# Patient Record
Sex: Female | Born: 1955 | Race: White | Hispanic: No | Marital: Married | Smoking: Never smoker
Health system: Southern US, Community
[De-identification: ages and names within clinical notes are randomized; demographics above are authoritative.]

## PROBLEM LIST (undated history)

## (undated) DIAGNOSIS — E7800 Pure hypercholesterolemia, unspecified: Secondary | ICD-10-CM

## (undated) HISTORY — PX: CHOLECYSTECTOMY: SHX55

## (undated) HISTORY — PX: ABDOMINAL HYSTERECTOMY: SHX81

## (undated) HISTORY — PX: HERNIA REPAIR: SHX51

---

## 2015-03-13 ENCOUNTER — Emergency Department (HOSPITAL_COMMUNITY): Payer: BLUE CROSS/BLUE SHIELD

## 2015-03-13 ENCOUNTER — Encounter (HOSPITAL_COMMUNITY): Payer: Self-pay | Admitting: Family Medicine

## 2015-03-13 ENCOUNTER — Emergency Department (HOSPITAL_COMMUNITY)
Admission: EM | Admit: 2015-03-13 | Discharge: 2015-03-13 | Disposition: A | Discharge status: Home / Self Care | Payer: BLUE CROSS/BLUE SHIELD | Attending: Emergency Medicine | Admitting: Emergency Medicine

## 2015-03-13 DIAGNOSIS — S8992XA Unspecified injury of left lower leg, initial encounter: Secondary | ICD-10-CM | POA: Diagnosis not present

## 2015-03-13 DIAGNOSIS — Y9301 Activity, walking, marching and hiking: Secondary | ICD-10-CM | POA: Insufficient documentation

## 2015-03-13 DIAGNOSIS — Y999 Unspecified external cause status: Secondary | ICD-10-CM | POA: Insufficient documentation

## 2015-03-13 DIAGNOSIS — Z8639 Personal history of other endocrine, nutritional and metabolic disease: Secondary | ICD-10-CM | POA: Insufficient documentation

## 2015-03-13 DIAGNOSIS — Y92093 Driveway of other non-institutional residence as the place of occurrence of the external cause: Secondary | ICD-10-CM | POA: Diagnosis not present

## 2015-03-13 DIAGNOSIS — M25562 Pain in left knee: Secondary | ICD-10-CM

## 2015-03-13 DIAGNOSIS — X58XXXA Exposure to other specified factors, initial encounter: Secondary | ICD-10-CM | POA: Diagnosis not present

## 2015-03-13 HISTORY — DX: Pure hypercholesterolemia, unspecified: E78.00

## 2015-03-13 MED ORDER — IBUPROFEN 400 MG PO TABS
800.0000 mg | ORAL_TABLET | Freq: Once | ORAL | Status: AC
  Administered 2015-03-13: 800 mg via ORAL
  Filled 2015-03-13: qty 2

## 2015-03-13 NOTE — ED Notes (Signed)
Pt stats that she was going down a steep driveway and states that she heard a popping sound from her left knee this morning.

## 2015-03-13 NOTE — ED Notes (Signed)
Pt here for left knee injury and pain. sts she was walking and felt a pop and pain was severe. sts unable to bear weight on knee.

## 2015-03-13 NOTE — Discharge Instructions (Signed)
Knee Pain Take Tylenol or Motrin for pain. Wear knee immobilizer. Follow up with your primary care physician upon returning home. The knee is the complex joint between your thigh and your lower leg. It is made up of bones, tendons, ligaments, and cartilage. The bones that make up the knee are:  The femur in the thigh.  The tibia and fibula in the lower leg.  The patella or kneecap riding in the groove on the lower femur. CAUSES  Knee pain is a common complaint with many causes. A few of these causes are:  Injury, such as:  A ruptured ligament or tendon injury.  Torn cartilage.  Medical conditions, such as:  Gout  Arthritis  Infections  Overuse, over training, or overdoing a physical activity. Knee pain can be minor or severe. Knee pain can accompany debilitating injury. Minor knee problems often respond well to self-care measures or get well on their own. More serious injuries may need medical intervention or even surgery. SYMPTOMS The knee is complex. Symptoms of knee problems can vary widely. Some of the problems are:  Pain with movement and weight bearing.  Swelling and tenderness.  Buckling of the knee.  Inability to straighten or extend your knee.  Your knee locks and you cannot straighten it.  Warmth and redness with pain and fever.  Deformity or dislocation of the kneecap. DIAGNOSIS  Determining what is wrong may be very straight forward such as when there is an injury. It can also be challenging because of the complexity of the knee. Tests to make a diagnosis may include:  Your caregiver taking a history and doing a physical exam.  Routine X-rays can be used to rule out other problems. X-rays will not reveal a cartilage tear. Some injuries of the knee can be diagnosed by:  Arthroscopy a surgical technique by which a small video camera is inserted through tiny incisions on the sides of the knee. This procedure is used to examine and repair internal knee joint  problems. Tiny instruments can be used during arthroscopy to repair the torn knee cartilage (meniscus).  Arthrography is a radiology technique. A contrast liquid is directly injected into the knee joint. Internal structures of the knee joint then become visible on X-ray film.  An MRI scan is a non X-ray radiology procedure in which magnetic fields and a computer produce two- or three-dimensional images of the inside of the knee. Cartilage tears are often visible using an MRI scanner. MRI scans have largely replaced arthrography in diagnosing cartilage tears of the knee.  Blood work.  Examination of the fluid that helps to lubricate the knee joint (synovial fluid). This is done by taking a sample out using a needle and a syringe. TREATMENT The treatment of knee problems depends on the cause. Some of these treatments are:  Depending on the injury, proper casting, splinting, surgery, or physical therapy care will be needed.  Give yourself adequate recovery time. Do not overuse your joints. If you begin to get sore during workout routines, back off. Slow down or do fewer repetitions.  For repetitive activities such as cycling or running, maintain your strength and nutrition.  Alternate muscle groups. For example, if you are a weight lifter, work the upper body on one day and the lower body the next.  Either tight or weak muscles do not give the proper support for your knee. Tight or weak muscles do not absorb the stress placed on the knee joint. Keep the muscles surrounding the knee  strong.  Take care of mechanical problems.  If you have flat feet, orthotics or special shoes may help. See your caregiver if you need help.  Arch supports, sometimes with wedges on the inner or outer aspect of the heel, can help. These can shift pressure away from the side of the knee most bothered by osteoarthritis.  A brace called an "unloader" brace also may be used to help ease the pressure on the most  arthritic side of the knee.  If your caregiver has prescribed crutches, braces, wraps or ice, use as directed. The acronym for this is PRICE. This means protection, rest, ice, compression, and elevation.  Nonsteroidal anti-inflammatory drugs (NSAIDs), can help relieve pain. But if taken immediately after an injury, they may actually increase swelling. Take NSAIDs with food in your stomach. Stop them if you develop stomach problems. Do not take these if you have a history of ulcers, stomach pain, or bleeding from the bowel. Do not take without your caregiver's approval if you have problems with fluid retention, heart failure, or kidney problems.  For ongoing knee problems, physical therapy may be helpful.  Glucosamine and chondroitin are over-the-counter dietary supplements. Both may help relieve the pain of osteoarthritis in the knee. These medicines are different from the usual anti-inflammatory drugs. Glucosamine may decrease the rate of cartilage destruction.  Injections of a corticosteroid drug into your knee joint may help reduce the symptoms of an arthritis flare-up. They may provide pain relief that lasts a few months. You may have to wait a few months between injections. The injections do have a small increased risk of infection, water retention, and elevated blood sugar levels.  Hyaluronic acid injected into damaged joints may ease pain and provide lubrication. These injections may work by reducing inflammation. A series of shots may give relief for as long as 6 months.  Topical painkillers. Applying certain ointments to your skin may help relieve the pain and stiffness of osteoarthritis. Ask your pharmacist for suggestions. Many over the-counter products are approved for temporary relief of arthritis pain.  In some countries, doctors often prescribe topical NSAIDs for relief of chronic conditions such as arthritis and tendinitis. A review of treatment with NSAID creams found that they  worked as well as oral medications but without the serious side effects. PREVENTION  Maintain a healthy weight. Extra pounds put more strain on your joints.  Get strong, stay limber. Weak muscles are a common cause of knee injuries. Stretching is important. Include flexibility exercises in your workouts.  Be smart about exercise. If you have osteoarthritis, chronic knee pain or recurring injuries, you may need to change the way you exercise. This does not mean you have to stop being active. If your knees ache after jogging or playing basketball, consider switching to swimming, water aerobics, or other low-impact activities, at least for a few days a week. Sometimes limiting high-impact activities will provide relief.  Make sure your shoes fit well. Choose footwear that is right for your sport.  Protect your knees. Use the proper gear for knee-sensitive activities. Use kneepads when playing volleyball or laying carpet. Buckle your seat belt every time you drive. Most shattered kneecaps occur in car accidents.  Rest when you are tired. SEEK MEDICAL CARE IF:  You have knee pain that is continual and does not seem to be getting better.  SEEK IMMEDIATE MEDICAL CARE IF:  Your knee joint feels hot to the touch and you have a high fever. MAKE SURE YOU:  Understand these instructions.  Will watch your condition.  Will get help right away if you are not doing well or get worse. Document Released: 05/21/2007 Document Revised: 10/16/2011 Document Reviewed: 05/21/2007 [facility] Patient Information 2015 [place], [place]. This information is not intended to replace advice given to you by your health care provider. Make sure you discuss any questions you have with your health care provider.

## 2015-03-13 NOTE — ED Provider Notes (Signed)
CSN: [id number]     Arrival date & time 03/13/15  1625 History  This chart was scribed for Catha Gosselin, PA-C, working with Elwin Mocha, MD by Elon Spanner, ED Scribe. This patient was seen in room TR06C/TR06C and the patient's care was started at 4:49 PM.    Chief Complaint  Patient presents with  . Knee Injury   The history is provided by the patient. No language interpreter was used.    HPI Comments: Yvette Schneider is a 59 y.o. female who presents to the Emergency Department complaining of a left knee pain onset 8:00 am today.  The patient reports she had a slight twisting injury to the left knee two weeks ago.  During that time she heard a slight pop but there was no pain.  Several days ago, after repeated use of her mobile home's stairs, she developed soreness in the left upper leg.  Today, the patient was walking down steep graded driveway when her knee gave way and she heard a loud pop.  At this time,she had severe left knee pain.  She waited until now to come to the ED so that she could attend her family reunion.  She reports she is unable to bear weight due to pain and instability.  No medications have been tried.  Patient does not use anti-coagulants. Patient is from out of town in returning home in 1 week.  Past Medical History  Diagnosis Date  . High cholesterol    Past Surgical History  Procedure Laterality Date  . Abdominal hysterectomy    . Cholecystectomy    . Hernia repair     History reviewed. No pertinent family history. History  Substance Use Topics  . Smoking status: Never Smoker   . Smokeless tobacco: Not on file  . Alcohol Use: No   OB History    No data available     Review of Systems  Constitutional: Negative for fever.  Musculoskeletal: Positive for arthralgias.      Allergies  Lortab  Home Medications   Prior to Admission medications   Not on File   BP 109/70 mmHg  Pulse 72  Temp(Src) 98.4 F (36.9 C) (Oral)  Resp 18  Ht   (1.727 m)  Wt 210 lb (95.255 kg)  BMI 31.94 kg/m2  SpO2 99% Physical Exam  Constitutional: She is oriented to person, place, and time. She appears well-developed and well-nourished. No distress.  HENT:  Head: Normocephalic and atraumatic.  Eyes: Conjunctivae and EOM are normal.  Neck: Neck supple. No tracheal deviation present.  Cardiovascular: Normal rate.   Pulmonary/Chest: Effort normal. No respiratory distress.  Musculoskeletal: Normal range of motion.  Left leg: 2+ DP pulse.  No calf, patellar, or fibular head tenderness.  Able to SLR.  Ambulatory with pain and limping gait.  No joint effusion.  Neurological: She is alert and oriented to person, place, and time.  Skin: Skin is warm and dry.  Psychiatric: She has a normal mood and affect. Her behavior is normal.  Nursing note and vitals reviewed.   ED Course  Procedures (including critical care time)  DIAGNOSTIC STUDIES: Oxygen Saturation is 99% on RA, normal by my interpretation.    COORDINATION OF CARE:  4:56 PM Discussed treatment plan with patient at bedside.  Patient acknowledges and agrees with plan.    Labs Review Labs Reviewed - No data to display  Imaging Review Dg Knee Complete 4 Views Left  03/13/2015   CLINICAL DATA:  Twisting injury  2 weeks ago with increasing pain and instability. No previous relevant surgery. Initial encounter.  EXAM: LEFT KNEE - COMPLETE 4+ VIEW  COMPARISON:  None.  FINDINGS: The mineralization and alignment are normal. There is no evidence of acute fracture or dislocation. The joint spaces are maintained. No joint effusion or focal soft tissue swelling identified.  IMPRESSION: No acute osseous findings or significant arthropathic changes.   Electronically Signed   By: Carey Bullocks M.D.   On: 03/13/2015 17:23     EKG Interpretation None      MDM   Final diagnoses:  Left knee pain   Patient presents for left knee pain after hearing a pop while walking today. I do not suspect  quadriceps tendon rupture since she is able to straight leg raise. X-ray of left knee is negative for acute fracture or dislocation. She was given a knee immobilizer. I discussed RICE and she can take ibuprofen or Tylenol for pain. She can follow-up with her PCP or orthopedics upon returning home for possible PT. Patient verbally agrees with the plan. Medications  ibuprofen (ADVIL,MOTRIN) tablet 800 mg (800 mg Oral Given 03/13/15 1722)   I personally performed the services described in this documentation, which was scribed in my presence. The recorded information has been reviewed and is accurate.   Catha Gosselin, PA-C 03/13/15 1745  Elwin Mocha, MD 03/13/15 2154

## 2016-11-18 IMAGING — DX DG KNEE COMPLETE 4+V*L*
4 series · 4 of 4 positions shown · non-contrast
Comparison: None.

CLINICAL DATA: Twisting injury 2 weeks ago with increasing pain and
instability. No previous relevant surgery. Initial encounter.

EXAM:
LEFT KNEE - COMPLETE 4+ VIEW

[knee ap]
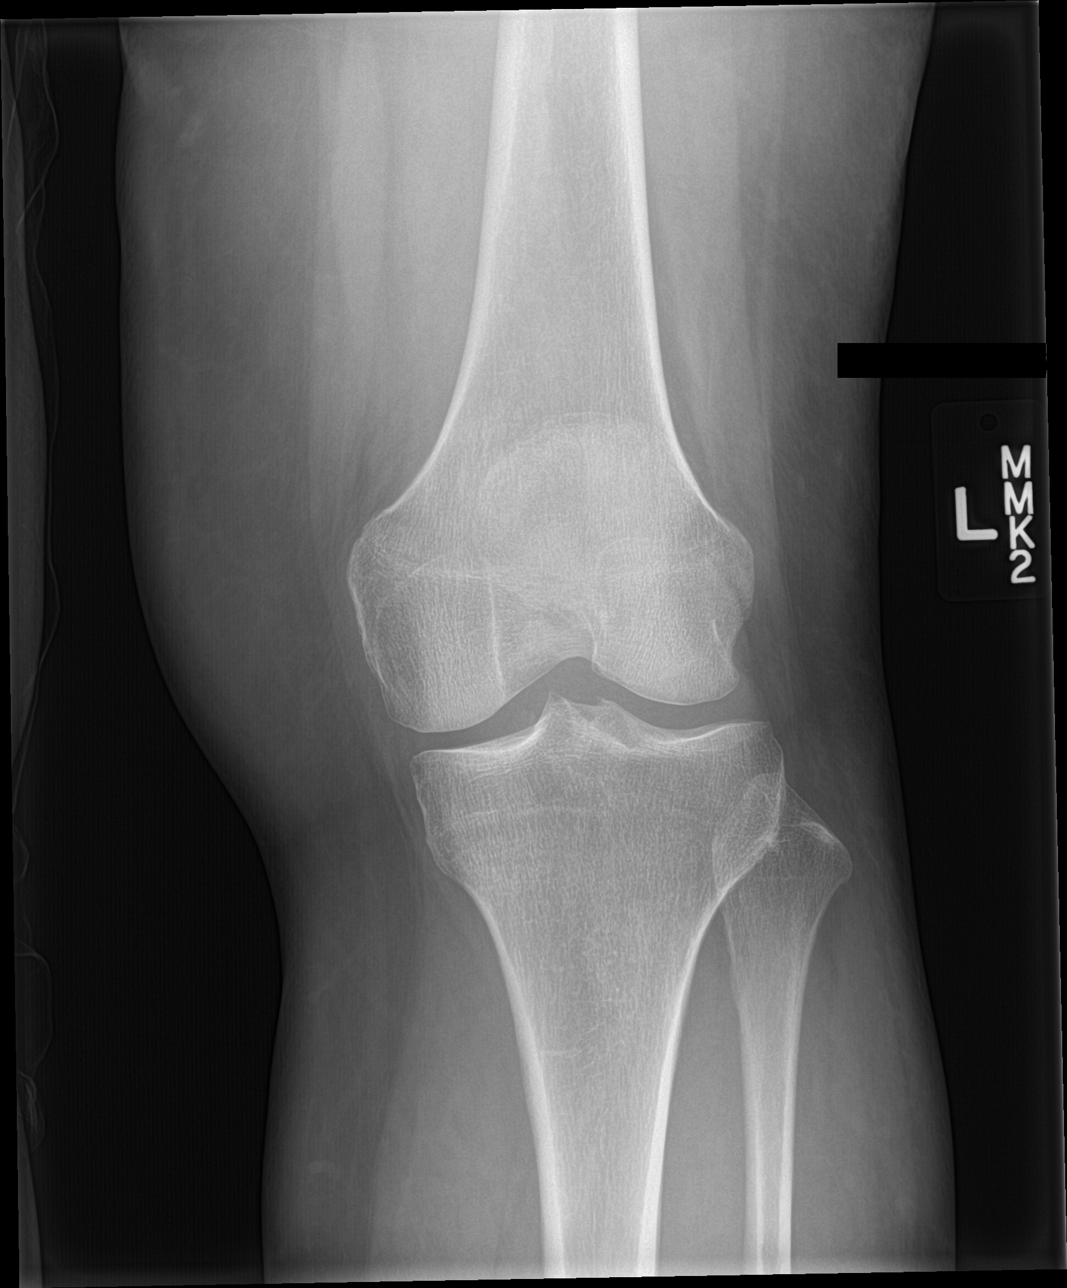

[tunnel]
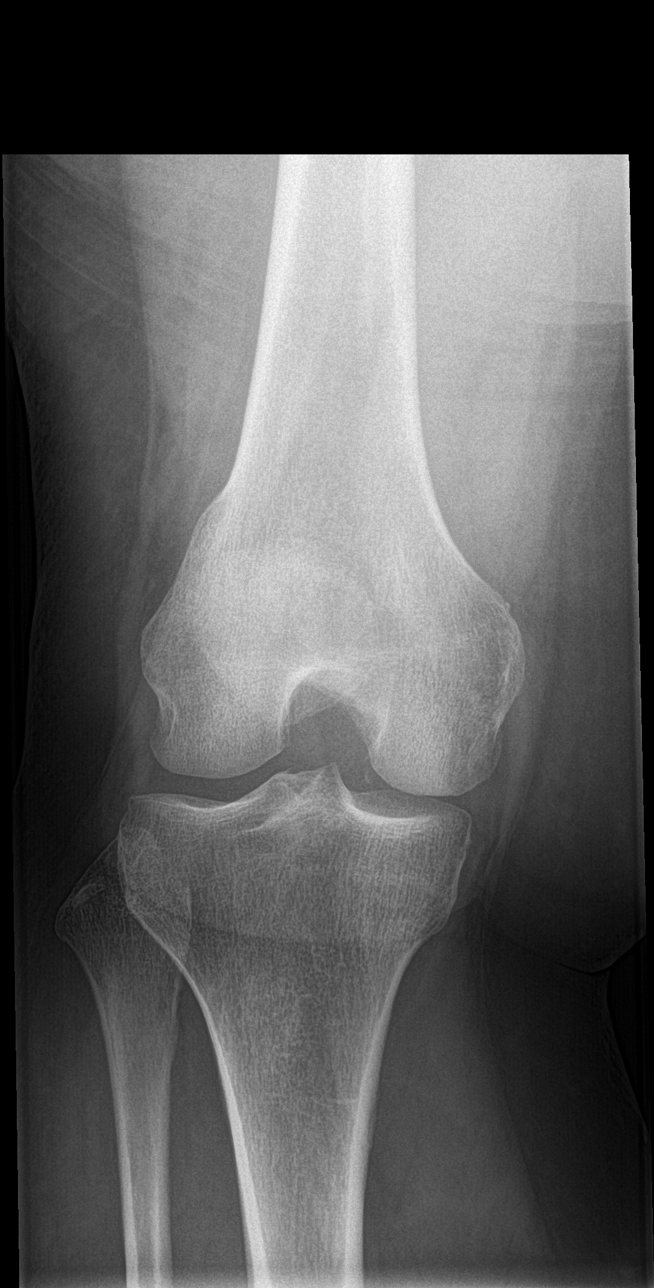

[knee lat]
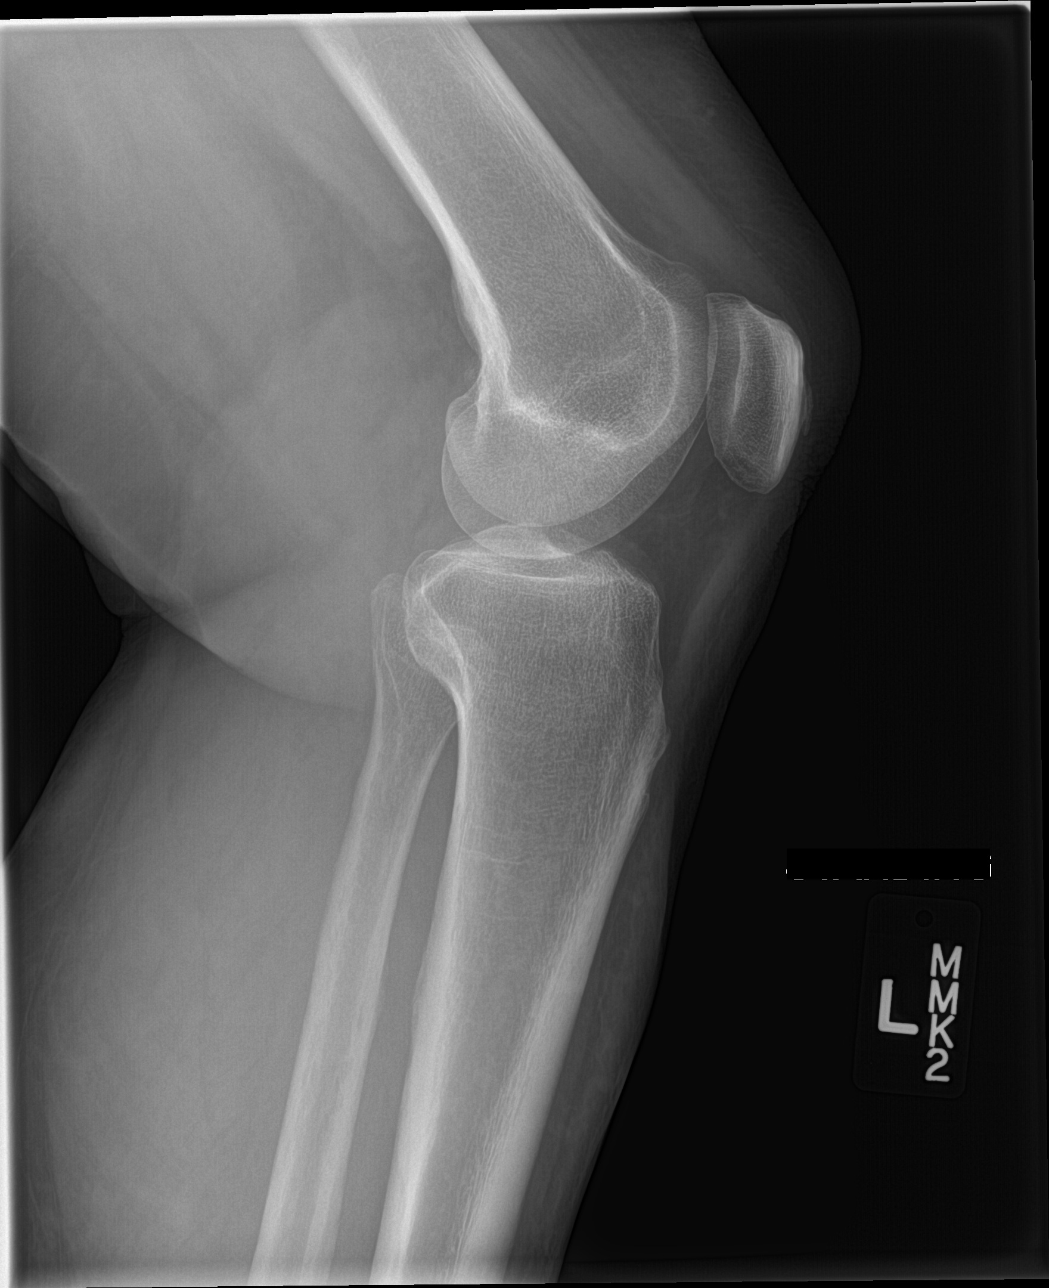

[knee sunrise]
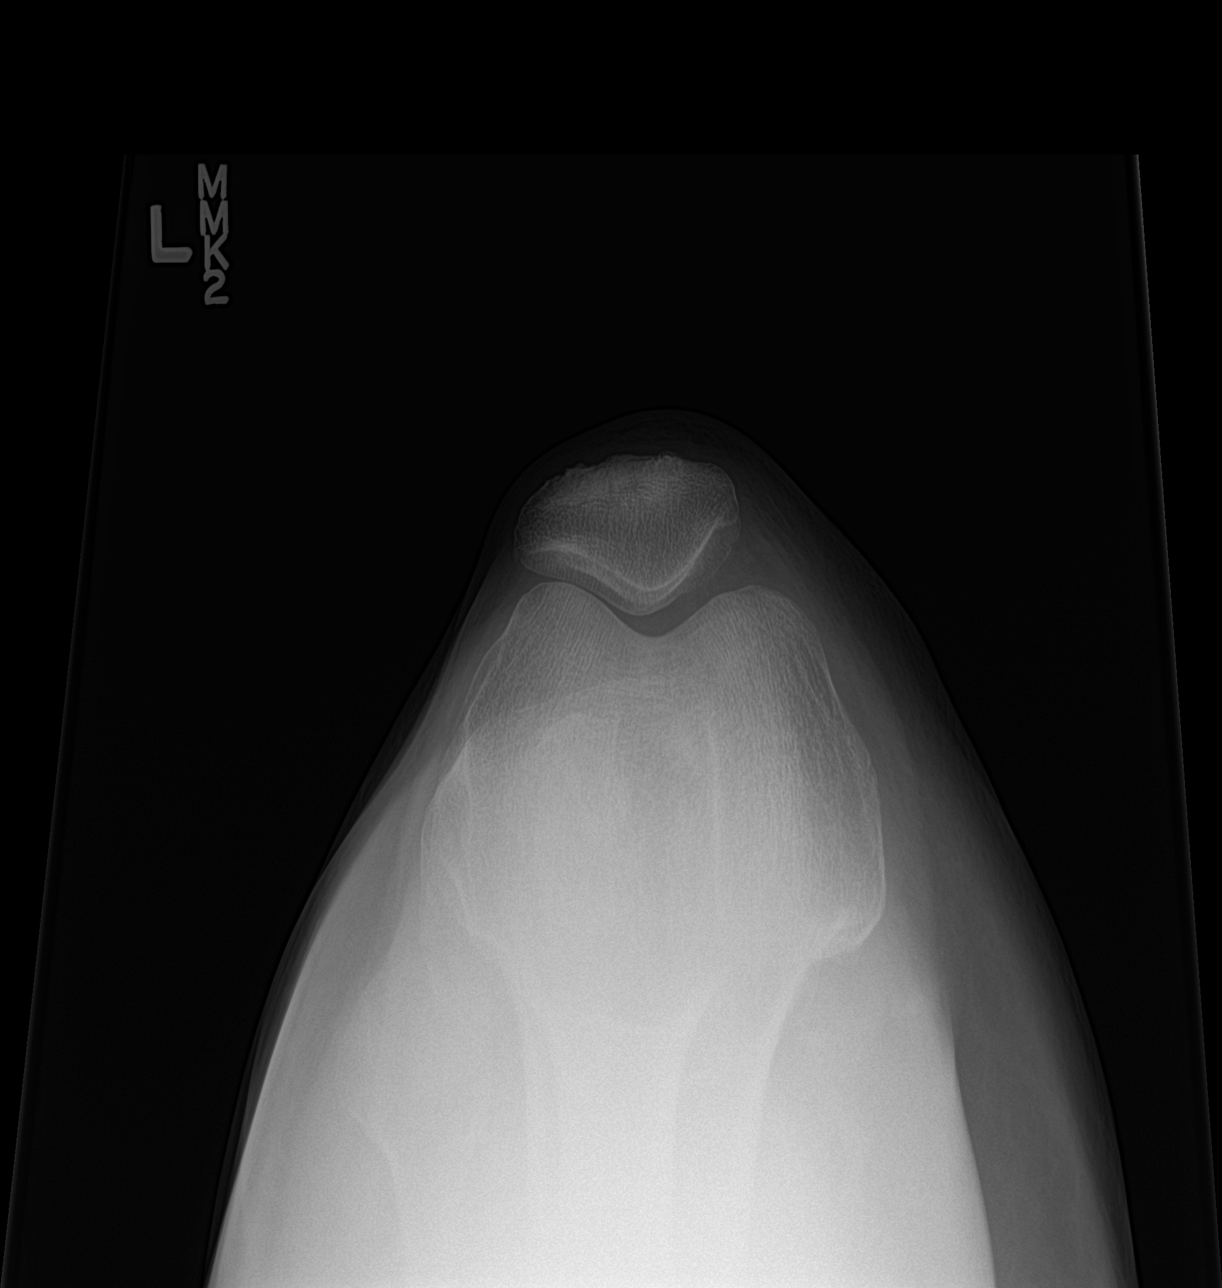

[4 of 4 positions shown; findings below may reference images not displayed]

FINDINGS: The mineralization and alignment are normal. There is no evidence of
acute fracture or dislocation. The joint spaces are maintained. No
joint effusion or focal soft tissue swelling identified.
IMPRESSION: No acute osseous findings or significant arthropathic changes.
